# Patient Record
Sex: Male | Born: 1990 | Race: White | Hispanic: No | Marital: Single | Smoking: Former smoker
Health system: Southern US, Community
[De-identification: ages and names within clinical notes are randomized; demographics above are authoritative.]

## PROBLEM LIST (undated history)

## (undated) DIAGNOSIS — Z8042 Family history of malignant neoplasm of prostate: Secondary | ICD-10-CM

## (undated) DIAGNOSIS — Z8051 Family history of malignant neoplasm of kidney: Secondary | ICD-10-CM

## (undated) DIAGNOSIS — Z8481 Family history of carrier of genetic disease: Secondary | ICD-10-CM

## (undated) HISTORY — DX: Family history of malignant neoplasm of prostate: Z80.42

## (undated) HISTORY — DX: Family history of malignant neoplasm of kidney: Z80.51

## (undated) HISTORY — DX: Family history of carrier of genetic disease: Z84.81

## (undated) HISTORY — PX: WISDOM TOOTH EXTRACTION: SHX21

---

## 2018-05-30 ENCOUNTER — Ambulatory Visit
Admission: RE | Admit: 2018-05-30 | Discharge: 2018-05-30 | Disposition: A | Discharge status: Home / Self Care | Payer: No Typology Code available for payment source | Source: Ambulatory Visit | Attending: Nurse Practitioner | Admitting: Nurse Practitioner

## 2018-05-30 ENCOUNTER — Other Ambulatory Visit: Payer: Self-pay | Admitting: Nurse Practitioner

## 2018-05-30 DIAGNOSIS — Z021 Encounter for pre-employment examination: Secondary | ICD-10-CM

## 2019-03-24 ENCOUNTER — Emergency Department (HOSPITAL_COMMUNITY)
Admission: EM | Admit: 2019-03-24 | Discharge: 2019-03-24 | Disposition: A | Discharge status: Home / Self Care | Payer: No Typology Code available for payment source | Attending: Emergency Medicine | Admitting: Emergency Medicine

## 2019-03-24 ENCOUNTER — Emergency Department (HOSPITAL_COMMUNITY): Payer: No Typology Code available for payment source

## 2019-03-24 ENCOUNTER — Encounter (HOSPITAL_COMMUNITY): Payer: Self-pay | Admitting: Emergency Medicine

## 2019-03-24 ENCOUNTER — Other Ambulatory Visit: Payer: Self-pay

## 2019-03-24 DIAGNOSIS — Z23 Encounter for immunization: Secondary | ICD-10-CM | POA: Insufficient documentation

## 2019-03-24 DIAGNOSIS — Z2914 Encounter for prophylactic rabies immune globin: Secondary | ICD-10-CM | POA: Insufficient documentation

## 2019-03-24 DIAGNOSIS — W540XXA Bitten by dog, initial encounter: Secondary | ICD-10-CM | POA: Diagnosis not present

## 2019-03-24 DIAGNOSIS — S81811A Laceration without foreign body, right lower leg, initial encounter: Secondary | ICD-10-CM | POA: Diagnosis present

## 2019-03-24 DIAGNOSIS — Y929 Unspecified place or not applicable: Secondary | ICD-10-CM | POA: Insufficient documentation

## 2019-03-24 DIAGNOSIS — Y9389 Activity, other specified: Secondary | ICD-10-CM | POA: Diagnosis not present

## 2019-03-24 DIAGNOSIS — Y999 Unspecified external cause status: Secondary | ICD-10-CM | POA: Insufficient documentation

## 2019-03-24 MED ORDER — RABIES IMMUNE GLOBULIN 150 UNIT/ML IM INJ
20.0000 [IU]/kg | INJECTION | Freq: Once | INTRAMUSCULAR | Status: AC
  Administered 2019-03-24: 2175 [IU] via INTRAMUSCULAR
  Filled 2019-03-24: qty 14.5

## 2019-03-24 MED ORDER — IBUPROFEN 400 MG PO TABS
600.0000 mg | ORAL_TABLET | Freq: Once | ORAL | Status: AC
  Administered 2019-03-24: 21:00:00 600 mg via ORAL
  Filled 2019-03-24: qty 1

## 2019-03-24 MED ORDER — RABIES VACCINE, PCEC IM SUSR
1.0000 mL | Freq: Once | INTRAMUSCULAR | Status: AC
  Administered 2019-03-24: 1 mL via INTRAMUSCULAR
  Filled 2019-03-24: qty 1

## 2019-03-24 MED ORDER — AMOXICILLIN-POT CLAVULANATE 875-125 MG PO TABS
1.0000 | ORAL_TABLET | Freq: Once | ORAL | Status: AC
  Administered 2019-03-24: 23:00:00 1 via ORAL
  Filled 2019-03-24: qty 1

## 2019-03-24 MED ORDER — AMOXICILLIN-POT CLAVULANATE 875-125 MG PO TABS: 1.0000 | ORAL_TABLET | Freq: Two times a day (BID) | ORAL | 0 refills | Status: DC

## 2019-03-24 NOTE — Discharge Instructions (Addendum)
Please read instructions below.  Keep your wound clean and covered. In 24 hours, you can get your wound wet; gently clean it with soap and water, pat it dry, and reapply a clean bandage. Elevate your leg as much as possible to help with swelling and pain. You can take ibuprofen/advil every 6 hours as needed for pain. Take the antibiotic as prescribed until completely gone. Report to the [facility]gent care for your repeat rabies vaccinations. Follow up with your primary care or urgent care for wound recheck in 3 days.  Return to the ER for fever, pus draining from wound, redness, or new or worsening symptoms.

## 2019-03-24 NOTE — ED Notes (Signed)
Patient transported to X-ray 

## 2019-03-24 NOTE — ED Notes (Signed)
Patient verbalizes understanding of discharge instructions. Opportunity for questioning and answers were provided. Armband removed by staff, pt discharged from ED via wheelchair to home.  

## 2019-03-24 NOTE — ED Provider Notes (Signed)
[facility] EMERGENCY DEPARTMENT Provider Note   CSN: [id number] Arrival date & time: 03/24/19  2017    History   Chief Complaint Chief Complaint  Patient presents with  . Animal Bite    HPI Carlos Rose is a 28 y.o. male without significant past medical history, presenting to the emergency department with a dog bite to his right calf that occurred prior to arrival.  Patient is [employer] and was in pursuit of a suspect when he encountered a dog.  He states the dog bit him through his pants.  He has associated wounds and pain.  Animal control does have the dog in custody and is quarantining, as rabies vaccination status is currently unknown.  Patient states his tetanus vaccine was updated less than 5 years ago.  He has no history of immunocompromise.  He has no other injuries to report.     The history is provided by the patient.    History reviewed. No pertinent past medical history.  There are no active problems to display for this patient.   History reviewed. No pertinent surgical history.      Home Medications    Prior to Admission medications   Medication Sig Start Date End Date Taking? Authorizing Provider  amoxicillin-clavulanate (AUGMENTIN) 875-125 MG tablet Take 1 tablet by mouth every 12 (twelve) hours. 03/24/19   Jezebelle Ledwell, [place] N, PA-C    Family History No family history on file.  Social History Social History   Tobacco Use  . Smoking status: Not on file  Substance Use Topics  . Alcohol use: Not on file  . Drug use: Not on file     Allergies   Patient has no allergy information on record.   Review of Systems Review of Systems  Musculoskeletal: Positive for myalgias.  Skin: Positive for wound.  Allergic/Immunologic: Negative for immunocompromised state.  Neurological: Negative for numbness.     Physical Exam Updated Vital Signs BP (!) 134/94 (BP Location: Right Arm)   Pulse 70   Temp 98.3 F (36.8 C) (Oral)   Resp 16   Ht  6' (1.829 m)   Wt 108 kg   SpO2 97%   BMI 32.28 kg/m   Physical Exam Vitals signs and nursing note reviewed.  Constitutional:      General: He is not in acute distress.    Appearance: He is well-developed.  HENT:     Head: Normocephalic and atraumatic.  Eyes:     Conjunctiva/sclera: Conjunctivae normal.  Cardiovascular:     Rate and Rhythm: Normal rate.     Comments: Intact PT pulses Pulmonary:     Effort: Pulmonary effort is normal.  Musculoskeletal:     Comments: Pt with 3 linear lacerations to the posteromedial right calf. Wounds are about 2-2.5cm in length and gaping. No muscles visualized at the base of the wound. No grossly contaminated. Mild assoc tenderness.  Neurological:     Mental Status: He is alert.  Psychiatric:        Mood and Affect: Mood normal.        Behavior: Behavior normal.      ED Treatments / Results  Labs (all labs ordered are listed, but only abnormal results are displayed) Labs Reviewed - No data to display  EKG None  Radiology Dg Tibia/fibula Right  Result Date: 03/24/2019 CLINICAL DATA:  Dog bite to right calf. Rule out foreign body. EXAM: RIGHT TIBIA AND FIBULA - 2 VIEW COMPARISON:  None. FINDINGS: Soft tissue irregularity  and lucency involving the posteromedial calf is consistent with the provided history. No radiopaque foreign body or fracture is identified. The knee and ankle are located. IMPRESSION: Soft tissue injury without evidence of acute osseous abnormality or radiopaque foreign body. Electronically Signed   By: Sebastian AcheAllen  Grady M.D.   On: 03/24/2019 21:16    Procedures .Marland Kitchen.Laceration Repair Date/Time: 03/24/2019 10:34 PM Performed by: Macklen Wilhoite, [place] N, PA-C Authorized by: Srihith Aquilino, [place] N, PA-C   Consent:    Consent obtained:  Verbal   Consent given by:  Patient   Risks discussed:  Infection, pain and poor cosmetic result   Alternatives discussed:  No treatment Anesthesia (see MAR for exact dosages):    Anesthesia method:   Local infiltration   Local anesthetic:  Lidocaine 2% WITH epi Laceration details:    Location:  Leg   Leg location:  R lower leg   Length (cm):  2 (3 lacerations about 2-2.5cm in length) Repair type:    Repair type:  Simple Pre-procedure details:    Preparation:  Patient was prepped and draped in usual sterile fashion and imaging obtained to evaluate for foreign bodies Exploration:    Hemostasis achieved with:  Direct pressure   Wound exploration: wound explored through full range of motion and entire depth of wound probed and visualized     Wound extent: no foreign bodies/material noted and no muscle damage noted   Treatment:    Area cleansed with:  Saline   Amount of cleaning:  Extensive   Irrigation solution:  Sterile saline   Irrigation method:  Pressure wash   Visualized foreign bodies/material removed: no   Skin repair:    Repair method:  Sutures   Suture size:  4-0   Suture material:  Nylon   Suture technique:  Simple interrupted   Number of sutures:  6 (2 sutures placed in each wound, totalling 6) Approximation:    Approximation:  Loose Post-procedure details:    Dressing:  Non-adherent dressing   Patient tolerance of procedure:  Tolerated well, no immediate complications   (including critical care time)  Medications Ordered in ED Medications  ibuprofen (ADVIL) tablet 600 mg (600 mg Oral Given 03/24/19 2041)  rabies immune globulin (HYPERAB/KEDRAB) injection 2,175 Units (2,175 Units Intramuscular Given 03/24/19 2205)  rabies vaccine (RABAVERT) injection 1 mL (1 mL Intramuscular Given 03/24/19 2202)     Initial Impression / Assessment and Plan / ED Course  I have reviewed the triage vital signs and the nursing notes.  Pertinent labs & imaging results that were available during my care of the patient were reviewed by me and considered in my medical decision making (see chart for details).        Patient presents with lacerations to right calf from a dog bite while  on duty with [employer].  Pt wounds irrigated copiously with sterile saline.  Wounds examined with visualization of the base and no foreign bodies seen.  Xray neg for retained FOB.  Capillary refill intact and pt without neurologic deficit.  Patient tetanus up to date. Patient rabies vaccine and immunoglobulin risk and benefit discussed.  Pt consents. Pain treated in the emergency department. Wounds closed loosely as they were gaping. Will discharge home with pain medication, Augmentin and requests for close follow-up with urgent care for recheck in 3 days. Discussed reasons to return sooner.  Discussed results, findings, treatment and follow up. Patient advised of return precautions. Patient verbalized understanding and agreed with plan.  Final Clinical Impressions(s) / ED  Diagnoses   Final diagnoses:  Dog bite, initial encounter    ED Discharge Orders         Ordered    amoxicillin-clavulanate (AUGMENTIN) 875-125 MG tablet  Every 12 hours     03/24/19 2242           Rachit Grim, [place] N, PA-C 03/24/19 2243    Gerhard Munch, MD 03/25/19 0003

## 2019-03-24 NOTE — ED Triage Notes (Signed)
[facility]- pt here due to getting bit by a diog during suspect chase. 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 EMS reports 3 lacs to his right knee.    126/80 82 HR 97% RA 16 RR

## 2019-03-25 MED FILL — AMOX-CLAV 875-125 MG TABLET: 875-125 | 7 days supply | Qty: 14 | Fill #0

## 2019-03-27 ENCOUNTER — Other Ambulatory Visit: Payer: Self-pay

## 2019-03-27 ENCOUNTER — Ambulatory Visit (HOSPITAL_COMMUNITY)
Admission: EM | Admit: 2019-03-27 | Discharge: 2019-03-27 | Disposition: A | Discharge status: Home / Self Care | Payer: 59 | Attending: Family Medicine | Admitting: Family Medicine

## 2019-03-27 DIAGNOSIS — W540XXD Bitten by dog, subsequent encounter: Secondary | ICD-10-CM

## 2019-03-27 DIAGNOSIS — W540XXA Bitten by dog, initial encounter: Secondary | ICD-10-CM | POA: Diagnosis not present

## 2019-03-27 DIAGNOSIS — S81851A Open bite, right lower leg, initial encounter: Secondary | ICD-10-CM

## 2019-03-27 DIAGNOSIS — Z203 Contact with and (suspected) exposure to rabies: Secondary | ICD-10-CM

## 2019-03-27 DIAGNOSIS — Z23 Encounter for immunization: Secondary | ICD-10-CM

## 2019-03-27 MED ORDER — RABIES VACCINE, PCEC IM SUSR
INTRAMUSCULAR | Status: AC
  Filled 2019-03-27: qty 1

## 2019-03-27 MED ORDER — RABIES VACCINE, PCEC IM SUSR
1.0000 mL | Freq: Once | INTRAMUSCULAR | Status: AC
  Administered 2019-03-27: 09:00:00 1 mL via INTRAMUSCULAR

## 2019-03-27 NOTE — Discharge Instructions (Addendum)
Keep clean and dry Watch for infection Call for follow up visits

## 2019-03-27 NOTE — ED Triage Notes (Signed)
Pt arrived for day 3 rabies and wound recheck

## 2019-03-27 NOTE — ED Provider Notes (Signed)
[facility]    CSN: [id number] Arrival date & time: 03/27/19  0847     History   Chief Complaint No chief complaint on file.   HPI Carlos Rose is a 28 y.o. male.   H[place] [employer] [place] on [place].  He was attacked from behind by pit bull which latched onto his right calf.  He was seen in the emergency department and sutures were placed in 3-4 deep wounds.  His calf is tender.  He has a lot of bruising.  He is taking care of the stitches.  No redness, swelling, discharge. He is here for follow-up rabies series.  The dog is being restrained and tested, however, was behind on its vaccinations.  No past medical history on file.  There are no active problems to display for this patient.   No past surgical history on file.     Home Medications    Prior to Admission medications   Medication Sig Start Date End Date Taking? Authorizing Provider  amoxicillin-clavulanate (AUGMENTIN) 875-125 MG tablet Take 1 tablet by mouth every 12 (twelve) hours. 03/24/19   Robinson, [place] N, PA-C    Family History No family history on file.  Social History Social History   Tobacco Use  . Smoking status: Not on file  Substance Use Topics  . Alcohol use: Not on file  . Drug use: Not on file     Allergies   Patient has no allergy information on record.   Review of Systems Review of Systems  Constitutional: Negative for chills and fever.  HENT: Negative for ear pain and sore throat.   Eyes: Negative for pain and visual disturbance.  Respiratory: Negative for cough and shortness of breath.   Cardiovascular: Negative for chest pain and palpitations.  Gastrointestinal: Negative for abdominal pain and vomiting.  Genitourinary: Negative for dysuria and hematuria.  Musculoskeletal: Negative for arthralgias and back pain.  Skin: Positive for wound. Negative for color change and rash.  Neurological: Negative for seizures and syncope.  All other systems  reviewed and are negative.    Physical Exam Triage Vital Signs ED Triage Vitals  Enc Vitals Group     BP 03/27/19 0915 135/80     Pulse Rate 03/27/19 0915 (!) 53     Resp 03/27/19 0915 20     Temp 03/27/19 0915 97.6 F (36.4 C)     Temp Source 03/27/19 0915 Oral     SpO2 03/27/19 0915 100 %   No data found.  Updated Vital Signs BP 135/80   Pulse (!) 53   Temp 97.6 F (36.4 C) (Oral)   Resp 20   SpO2 100%     Bilateral Near:     Physical Exam Constitutional:      General: He is not in acute distress.    Appearance: He is well-developed. He is not ill-appearing.  HENT:     Head: Normocephalic and atraumatic.  Eyes:     Conjunctiva/sclera: Conjunctivae normal.     Pupils: Pupils are equal, round, and reactive to light.  Neck:     Musculoskeletal: Normal range of motion.  Cardiovascular:     Rate and Rhythm: Normal rate.  Pulmonary:     Effort: Pulmonary effort is normal. No respiratory distress.  Abdominal:     General: There is no distension.     Palpations: Abdomen is soft.  Musculoskeletal: Normal range of motion.  Skin:    General: Skin is warm and dry.  Comments: Right posterior calf has mild soft tissue swelling generally.  There is ecchymosis that is 12 x 14 cm.  In the center of this there for deep linear wounds that have 2 loose sutures each.  No erythema or tenderness.  Minimal induration  Neurological:     General: No focal deficit present.     Mental Status: He is alert.  Psychiatric:        Mood and Affect: Mood normal.        Behavior: Behavior normal.      UC Treatments / Results  Labs (all labs ordered are listed, but only abnormal results are displayed) Labs Reviewed - No data to display  EKG None  Radiology No results found.  Procedures Procedures (including critical care time)  Medications Ordered in UC Medications  rabies vaccine (RABAVERT) injection 1 mL (1 mL Intramuscular Given 03/27/19 0918)    Initial Impression /  Assessment and Plan / UC Course  I have reviewed the triage vital signs and the nursing notes.  Pertinent labs & imaging results that were available during my care of the patient were reviewed by me and considered in my medical decision making (see chart for details).     Good wound care.  No evidence of infection.  Discussed expectations.  Rabies vaccinations.  Wound care.  Return visits. Final Clinical Impressions(s) / UC Diagnoses   Final diagnoses:  Dog bite, subsequent encounter  Need for prophylactic vaccination against rabies     Discharge Instructions     Keep clean and dry Watch for infection Call for follow up visits   ED Prescriptions    None     Controlled Substance Prescriptions [place] Controlled Substance Registry consulted? Not Applicable   Eustace Moore, MD 03/27/19 1110

## 2019-03-31 ENCOUNTER — Ambulatory Visit (HOSPITAL_COMMUNITY)
Admission: EM | Admit: 2019-03-31 | Discharge: 2019-03-31 | Disposition: A | Discharge status: Home / Self Care | Payer: 59 | Attending: Family Medicine | Admitting: Family Medicine

## 2019-03-31 DIAGNOSIS — Z203 Contact with and (suspected) exposure to rabies: Secondary | ICD-10-CM | POA: Diagnosis not present

## 2019-03-31 DIAGNOSIS — Z23 Encounter for immunization: Secondary | ICD-10-CM | POA: Diagnosis not present

## 2019-03-31 MED ORDER — RABIES VACCINE, PCEC IM SUSR
1.0000 mL | Freq: Once | INTRAMUSCULAR | Status: AC
  Administered 2019-03-31: 1 mL via INTRAMUSCULAR

## 2019-03-31 MED ORDER — RABIES VACCINE, PCEC IM SUSR
INTRAMUSCULAR | Status: AC
  Filled 2019-03-31: qty 1

## 2019-03-31 NOTE — ED Triage Notes (Signed)
Pt presents to UC for day 7 of rabies vaccine.

## 2019-04-07 ENCOUNTER — Ambulatory Visit (HOSPITAL_COMMUNITY)
Admission: EM | Admit: 2019-04-07 | Discharge: 2019-04-07 | Disposition: A | Discharge status: Home / Self Care | Payer: 59 | Attending: Family Medicine | Admitting: Family Medicine

## 2019-04-07 ENCOUNTER — Other Ambulatory Visit: Payer: Self-pay

## 2019-04-07 DIAGNOSIS — Z203 Contact with and (suspected) exposure to rabies: Secondary | ICD-10-CM

## 2019-04-07 DIAGNOSIS — Z23 Encounter for immunization: Secondary | ICD-10-CM

## 2019-04-07 MED ORDER — RABIES VACCINE, PCEC IM SUSR
INTRAMUSCULAR | Status: AC
  Filled 2019-04-07: qty 1

## 2019-04-07 MED ORDER — RABIES VACCINE, PCEC IM SUSR
1.0000 mL | Freq: Once | INTRAMUSCULAR | Status: AC
  Administered 2019-04-07: 1 mL via INTRAMUSCULAR

## 2019-04-07 NOTE — ED Triage Notes (Signed)
Pt presents today for rabies vaccine.

## 2019-12-25 ENCOUNTER — Ambulatory Visit: Payer: 59 | Admitting: Family Medicine

## 2020-01-09 ENCOUNTER — Ambulatory Visit: Payer: 59 | Attending: Internal Medicine

## 2020-01-09 DIAGNOSIS — Z23 Encounter for immunization: Secondary | ICD-10-CM | POA: Insufficient documentation

## 2020-01-09 NOTE — Progress Notes (Signed)
   Covid-19 Vaccination Clinic  Name:  Freddrick Gladson    MRN: [id number] DOB: 01-03-1991  01/09/2020  Mr. Lorson was observed post Covid-19 immunization for 15 minutes without incidence. He was provided with Vaccine Information Sheet and instruction to access the V-Safe system.   Mr. Spinney was instructed to call 911 with any severe reactions post vaccine: Marland Kitchen Difficulty breathing  . Swelling of your face and throat  . A fast heartbeat  . A bad rash all over your body  . Dizziness and weakness    Immunizations Administered    Name Date Dose VIS Date Route   Pfizer COVID-19 Vaccine 01/09/2020 11:48 AM 0.3 mL 10/31/2019 Intramuscular   Manufacturer: [employer], [employer]   Lot: [id number]   NDC: [address]-1

## 2020-02-03 ENCOUNTER — Ambulatory Visit: Payer: 59 | Attending: Internal Medicine

## 2020-02-03 DIAGNOSIS — Z23 Encounter for immunization: Secondary | ICD-10-CM

## 2020-02-03 NOTE — Progress Notes (Signed)
   Covid-19 Vaccination Clinic  Name:  Carlos Rose    MRN: [id number] DOB: [birth year]  02/03/2020  [name] was observed post Covid-19 immunization for 15 minutes without incident. He was provided with Vaccine Information Sheet and instruction to access the V-Safe system.   [name] was instructed to call 911 with any severe reactions post vaccine: Marland Kitchen Difficulty breathing  . Swelling of face and throat  . A fast heartbeat  . A bad rash all over body  . Dizziness and weakness   Immunizations Administered    Name Date Dose VIS Date Route   Pfizer COVID-19 Vaccine 02/03/2020 11:36 AM 0.3 mL 10/31/2019 Intramuscular   Manufacturer: [employer], [employer]   Lot: [id number]   NDC: [address]-0

## 2020-05-10 ENCOUNTER — Encounter: Payer: Self-pay | Admitting: Family Medicine

## 2020-05-10 ENCOUNTER — Ambulatory Visit: Payer: 59 | Admitting: Family Medicine

## 2020-05-10 ENCOUNTER — Other Ambulatory Visit: Payer: Self-pay

## 2020-05-10 VITALS — BP 124/86 | HR 71 | Temp 98.1°F | Ht 72.0 in | Wt 253.8 lb

## 2020-05-10 DIAGNOSIS — Z87820 Personal history of traumatic brain injury: Secondary | ICD-10-CM

## 2020-05-10 DIAGNOSIS — E6609 Other obesity due to excess calories: Secondary | ICD-10-CM | POA: Diagnosis not present

## 2020-05-10 DIAGNOSIS — M25572 Pain in left ankle and joints of left foot: Secondary | ICD-10-CM

## 2020-05-10 DIAGNOSIS — G44229 Chronic tension-type headache, not intractable: Secondary | ICD-10-CM | POA: Insufficient documentation

## 2020-05-10 DIAGNOSIS — Z6834 Body mass index (BMI) 34.0-34.9, adult: Secondary | ICD-10-CM

## 2020-05-10 DIAGNOSIS — M25561 Pain in right knee: Secondary | ICD-10-CM

## 2020-05-10 DIAGNOSIS — S069X9A Unspecified intracranial injury with loss of consciousness of unspecified duration, initial encounter: Secondary | ICD-10-CM | POA: Insufficient documentation

## 2020-05-10 DIAGNOSIS — G8929 Other chronic pain: Secondary | ICD-10-CM

## 2020-05-10 DIAGNOSIS — M25562 Pain in left knee: Secondary | ICD-10-CM | POA: Insufficient documentation

## 2020-05-10 DIAGNOSIS — M25571 Pain in right ankle and joints of right foot: Secondary | ICD-10-CM

## 2020-05-10 DIAGNOSIS — M5442 Lumbago with sciatica, left side: Secondary | ICD-10-CM

## 2020-05-10 DIAGNOSIS — Z7689 Persons encountering health services in other specified circumstances: Secondary | ICD-10-CM

## 2020-05-10 LAB — LIPID PANEL
Cholesterol: 167 mg/dL (ref 0–200)
HDL: 45.2 mg/dL (ref >39.00)
LDL Cholesterol: 102 mg/dL — ABNORMAL HIGH (ref 0–99)
NonHDL: 121.36
Total CHOL/HDL Ratio: 4
Triglycerides: 98 mg/dL (ref 0.0–149.0)
VLDL: 19.6 mg/dL (ref 0.0–40.0)

## 2020-05-10 LAB — CBC WITH DIFFERENTIAL/PLATELET
Basophils Absolute: 0 10*3/uL (ref 0.0–0.1)
Basophils Relative: 0.3 % (ref 0.0–3.0)
Eosinophils Absolute: 0.3 10*3/uL (ref 0.0–0.7)
Eosinophils Relative: 5.1 % — ABNORMAL HIGH (ref 0.0–5.0)
HCT: 38.3 % — ABNORMAL LOW (ref 39.0–52.0)
Hemoglobin: 12.8 g/dL — ABNORMAL LOW (ref 13.0–17.0)
Lymphocytes Relative: 34.3 % (ref 12.0–46.0)
Lymphs Abs: 2.2 10*3/uL (ref 0.7–4.0)
MCHC: 33.6 g/dL (ref 30.0–36.0)
MCV: 82.1 fl (ref 78.0–100.0)
Monocytes Absolute: 0.4 10*3/uL (ref 0.1–1.0)
Monocytes Relative: 5.7 % (ref 3.0–12.0)
Neutro Abs: 3.6 10*3/uL (ref 1.4–7.7)
Neutrophils Relative %: 54.6 % (ref 43.0–77.0)
Platelets: 266 10*3/uL (ref 150.0–400.0)
RBC: 4.66 Mil/uL (ref 4.22–5.81)
RDW: 13.7 % (ref 11.5–15.5)
WBC: 6.5 10*3/uL (ref 4.0–10.5)

## 2020-05-10 LAB — COMPREHENSIVE METABOLIC PANEL
ALT: 28 U/L (ref 0–53)
AST: 24 U/L (ref 0–37)
Albumin: 4.4 g/dL (ref 3.5–5.2)
Alkaline Phosphatase: 51 U/L (ref 39–117)
BUN: 19 mg/dL (ref 6–23)
CO2: 27 mEq/L (ref 19–32)
Calcium: 9.2 mg/dL (ref 8.4–10.5)
Chloride: 104 mEq/L (ref 96–112)
Creatinine, Ser: 1.02 mg/dL (ref 0.40–1.50)
GFR: 86.04 mL/min (ref >60.00)
Glucose, Bld: 91 mg/dL (ref 70–99)
Potassium: 4.2 mEq/L (ref 3.5–5.1)
Sodium: 136 mEq/L (ref 135–145)
Total Bilirubin: 0.4 mg/dL (ref 0.2–1.2)
Total Protein: 7 g/dL (ref 6.0–8.3)

## 2020-05-10 NOTE — Patient Instructions (Addendum)
Good to see you today  Find out if they will accept a letter from Nurse Practitioner and let me know  Look for the date of your last tetanus vaccine

## 2020-05-10 NOTE — Progress Notes (Signed)
Subjective:    Patient ID: Carlos Rose, male    DOB: Aug 12, 1991, 28 y.o.   MRN: [id number]  HPI Chief Complaint  Patient presents with  . establishing care  This is a 29 yo male who presents today to establish care. 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 Has been married to wife Carlos Rose for 1 year. Enjoys hanging out, exercise, video games.   Last CPE- 2019- pre employement Tdap- ? 2018 Covid- has completed series Dental- going today Eye- within last year Exercise- regular, heavy weights, running, stretching Sleep- 6-7 hours a night, feels rested Diet- usually brings his lunch, high protein, lower carb most of the time.   [employer] claim- [employer] [phone]- 2019. Air[employer]. History of TBI, sciatic nerve impingement, chronic back injury. Requesting [employer] letter. Unsure if this can be written by NP- he will find out.   Headaches- occasional, light sensitive, temple area, 2-3x/ week, 2-8 hours, hydrate, hot showers, naproxen. No visual changes.   Mild TBI- some problems with short term memory, has to write everything down, difficulty remembering names, face. No irritability.   Right shoulder- old baseball injury, little problems with this  Back pain- mid to lower, spasms, 3-4 times a week, increased frequency, has to lie down on hard surface, relief in 15-20 minutes. Sitting for long periods of time, jerking, twisting.   Knees- wear and tear, seem to be getting better, some left giving out in past, now better. Occasionally feels pain with going upstairs. Occasional swelling.   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 Daily stretching helps. Pain intolerable 2-3 times a week. Occasional naproxen/ ibuprofen. PT summer 2018.     Review of Systems Per above and denies chest pain, shortness of breath, abdominal pain, diarrhea, constipation, dysuria, urinary  frequency, hematuria.    Objective:   Physical Exam  Physical Exam  Constitutional: Oriented to person, place, and time. He appears well-developed and well-nourished.  HENT:  Head: Normocephalic and atraumatic.  Eyes: Conjunctivae are normal.  Neck: Normal range of motion. Neck supple.  Cardiovascular: Normal rate, regular rhythm and normal heart sounds.   Pulmonary/Chest: Effort normal and breath sounds normal.  Musculoskeletal: No LE edema. Normal gait.  Neurological: Alert and oriented to person, place, and time.  Skin: Skin is warm and dry.  Psychiatric: Normal mood and affect. Behavior is normal. Judgment and thought content normal.  Vitals reviewed.    BP 124/86   Pulse 71   Temp 98.1 F (36.7 C) (Temporal)   Ht 6' (1.829 m)   Wt 253 lb 12 oz (115.1 kg)   SpO2 98%   BMI 34.41 kg/m  Wt Readings from Last 3 Encounters:  05/10/20 253 lb 12 oz (115.1 kg)  03/24/19 238 lb (108 kg)        Assessment & Plan:  1. Encounter to establish care - reviewed available records in EMR as well as records that patient is with him today -He has requested some paperwork be completed and further information is required to complete, he will get back in touch with me regarding this  2. Class 1 obesity due to excess calories without serious comorbidity with body mass index (BMI) of 34.0 to 34.9 in adult -Encouraged healthy food choices, adequate hydration, continue regular exercise - CBC with Differential - Comprehensive metabolic panel - Lipid Panel  3. History of traumatic brain injury -Continued symptoms per above  4. Chronic tension-type headache, not intractable -Occurring several times a week, resolved with over-the-counter medication and good hydration.  Discussed avoiding triggers.  5. Chronic pain of both ankles -He is able to do most activities without impairment.  6. Chronic bilateral low back pain with left-sided sciatica -Encouraged him to continue regular  stretching and exercise  7. Chronic pain of both knees -Mostly triggered with running up stairs, seems to do okay with other moderate to high intensity exercise and daily activity  Over 45 minutes were spent face-to-face with the patient during this encounter and >50% of that time was spent on counseling and coordination of care  This visit occurred during the SARS-CoV-2 public health emergency.  Safety protocols were in place, including screening questions prior to the visit, additional usage of staff PPE, and extensive cleaning of exam room while observing appropriate contact time as indicated for disinfecting solutions.    Olean Ree, FNP-BC  [facility], [facility]  05/10/2020 2:57 PM

## 2020-05-17 ENCOUNTER — Encounter: Payer: Self-pay | Admitting: *Deleted

## 2020-06-07 ENCOUNTER — Encounter: Payer: Self-pay | Admitting: Family Medicine

## 2020-06-11 ENCOUNTER — Encounter: Payer: Self-pay | Admitting: Family Medicine

## 2020-06-21 ENCOUNTER — Encounter: Payer: Self-pay | Admitting: Family Medicine

## 2020-06-23 ENCOUNTER — Other Ambulatory Visit: Payer: Self-pay | Admitting: Family Medicine

## 2020-06-23 DIAGNOSIS — Z809 Family history of malignant neoplasm, unspecified: Secondary | ICD-10-CM

## 2020-06-23 DIAGNOSIS — Z8481 Family history of carrier of genetic disease: Secondary | ICD-10-CM

## 2020-06-28 ENCOUNTER — Inpatient Hospital Stay: Payer: 59 | Attending: Oncology | Admitting: Licensed Clinical Social Worker

## 2020-06-28 ENCOUNTER — Encounter: Payer: Self-pay | Admitting: Licensed Clinical Social Worker

## 2020-06-28 ENCOUNTER — Inpatient Hospital Stay: Payer: 59

## 2020-06-28 ENCOUNTER — Other Ambulatory Visit: Payer: Self-pay

## 2020-06-28 DIAGNOSIS — Z8042 Family history of malignant neoplasm of prostate: Secondary | ICD-10-CM

## 2020-06-28 DIAGNOSIS — Z8051 Family history of malignant neoplasm of kidney: Secondary | ICD-10-CM | POA: Insufficient documentation

## 2020-06-28 DIAGNOSIS — Z8481 Family history of carrier of genetic disease: Secondary | ICD-10-CM
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
## 2020-08-02 ENCOUNTER — Telehealth: Payer: Self-pay | Admitting: Licensed Clinical Social Worker

## 2020-08-02 ENCOUNTER — Encounter: Payer: Self-pay | Admitting: Licensed Clinical Social Worker

## 2020-08-02 ENCOUNTER — Ambulatory Visit: Payer: Self-pay | Admitting: Licensed Clinical Social Worker

## 2020-08-02 DIAGNOSIS — Z1379 Encounter for other screening for genetic and chromosomal anomalies: Secondary | ICD-10-CM | POA: Insufficient documentation

## 2020-08-02 DIAGNOSIS — Z8042 Family history of malignant neoplasm of prostate: Secondary | ICD-10-CM

## 2020-08-02 DIAGNOSIS — Z8051 Family history of malignant neoplasm of kidney: Secondary | ICD-10-CM

## 2020-08-02 DIAGNOSIS — Z8481 Family history of carrier of genetic disease: Secondary | ICD-10-CM

## 2020-08-02 NOTE — Telephone Encounter (Signed)
Revealed negative genetic testing.  Revealed that a VUS in PMS2 was identified. This normal result is reassuring.  It is unlikely that there is an increased risk of cancer due to a mutation in one of these genes.  However, genetic testing is not perfect, and cannot definitively rule out a hereditary cause.  It will be important for him to keep in contact with genetics to learn if any additional testing may be needed in the future.

## 2020-08-02 NOTE — Progress Notes (Addendum)
HPI:  [name] was previously seen in the [facility] clinic due to a family history of cancer, family history of BRCA2 mutation and concerns regarding a hereditary predisposition to cancer. Please refer to our prior cancer genetics clinic note for more information regarding our discussion, assessment and recommendations, at the time. [name] recent genetic test results were disclosed to him, as were recommendations warranted by these results. These results and recommendations are discussed in more detail below.  CANCER HISTORY:  Oncology History   No history exists.    FAMILY HISTORY:  We obtained a detailed, 4-generation family history.  Significant diagnoses are listed below: Family History  Problem Relation Age of Onset  . Diabetes Maternal Grandmother   . Kidney cancer Maternal Grandmother   . Hearing loss Maternal Grandfather   . Hyperlipidemia Maternal Grandfather   . Hypertension Maternal Grandfather   . Prostate cancer Paternal Grandfather    [name] does not have children yet. He has 2 younger brothers who have not had genetic testing yet but have appointments to in [place].   [name] father is living at 19 and does not want to have genetic testing. Patient has 4 paternal aunts and 1 paternal uncle. One aunt is his father's twin, and she has not had testing. Two other aunts are also twins and one has reportedly tested positive for BRCA2. His uncle also reportedly tested positive, as well as a cousin. Paternal grandmother passed in her 28s. Paternal grandfather had prostate cancer that was metastatic in his 61s.   [name] mother is living at 80, no cancer history. Patient has 1 maternal aunt, 1 maternal uncle, no cancers. No known cancers in maternal cousins. Maternal grandfather is living in his 73s. Grandmother had kidney cancer and is living in her 65s.   [name] is aware of previous family history of genetic testing for hereditary cancer risks.  Patient's maternal ancestors are of English/Scottish/Irish descent, and paternal ancestors are of Middle Eastern/Mediterranean/European descent. There is no reported Ashkenazi Jewish ancestry. There is no known consanguinity.     GENETIC TEST RESULTS: Genetic testing reported out on 07/29/2020 through the [facility] Common Hereditary Cancers Panel +Renal  cancer panel found no pathogenic mutations.   The Common Hereditary Cancers Panel+ Renal Cancer Panel  offered by [facility] includes sequencing and/or deletion duplication testing of the following 79 genes:APC*, ATM*, AXIN2, BAP1, BARD1, BMPR1A, BRCA1, BRCA2, BRIP1, CDC73, CDH1, CDK4, CDKN1C, CDKN2A (p14ARF), CDKN2A (p16INK4a), CHEK2, CTNNA1, DICER1*, DIS3L2, EPCAM*, FH*, FLCN, GPC3*, GREM1*, HOXB13, KIT, MEN1*, MET*, MLH1*, MSH2*, MSH3*, MSH6*, MUTYH, NBN, NF1*, NTHL1, PALB2, PDGFRA, PMS2*, POLD1*, POLE, PTEN*, RAD50, RAD51C, RAD51D, REST, RNF43, SDHA*, SDHB, SDHC*, SDHD, SMAD4, SMARCA4, SMARCB1, STK11, TP53, TSC1*, TSC2, VHL, WT1.   The test report has been scanned into EPIC and is located under the Molecular Pathology section of the Results Review tab.  A portion of the result report is included below for reference.     We discussed with [name] that because current genetic testing is not perfect, it is possible there may be a gene mutation in one of these genes that current testing cannot detect, but that chance is small.  We also discussed, that there could be another gene that has not yet been discovered, or that we have not yet tested, that is responsible for the cancer diagnoses in the family. It is also possible there is a hereditary cause for the cancer in the family that [name]  and therefore was not identified in his testing.  Therefore, it is important to remain in touch with cancer genetics in the future so that we can continue to offer [name] the most up to date genetic testing.   Genetic testing did identify a variant  of uncertain significance (VUS) in the PMS2 gene called c.1501G>A.  At this time, it is unknown if this variant is associated with increased cancer risk or if this is a normal finding, but most variants such as this get reclassified to being inconsequential. It should not be used to make medical management decisions. With time, we suspect the lab will determine the significance of this variant, if any. If we do learn more about it, we will try to contact [name] to discuss it further. However, it is important to stay in touch with [place] periodically and keep the address and phone number up to date.   ADDITIONAL GENETIC TESTING: We discussed with [name] that his genetic testing was fairly extensive.  If there are genes identified to increase cancer risk that can be analyzed in the future, we would be happy to discuss and coordinate this testing at that time.    CANCER SCREENING RECOMMENDATIONS: [name] test result is considered negative (normal).  This means that we have not identified a hereditary cause for his family history of cancer at this time, and we also did not identify a BRCA2 mutation in him which we know is on the paternal side of the family.   While reassuring, this does not definitively rule out a hereditary predisposition to cancer. It is still possible that there could be genetic mutations that are undetectable by current technology. There could be genetic mutations in genes that have not been tested or identified to increase cancer risk.  Therefore, it is recommended he continue to follow the cancer management and screening guidelines provided by his primary healthcare provider.   An individual's cancer risk and medical management are not determined by genetic test results alone. Overall cancer risk assessment incorporates additional factors, including personal medical history, family history, and any available genetic information that may result in a personalized plan for cancer  prevention and surveillance.  RECOMMENDATIONS FOR FAMILY MEMBERS:  Relatives in this family might be at some increased risk of developing cancer, over the general population risk, simply due to the family history of cancer.  We recommended male relatives in this family have a yearly mammogram beginning at age 92, or 24 years younger than the earliest onset of cancer, an annual clinical breast exam, and perform monthly breast self-exams. Male relatives in this family should also have a gynecological exam as recommended by their primary provider.  All family members should be referred for colonoscopy starting at age 13.   Based on [name] family history, we recommended paternal relatives (including brothers) have genetic counseling and testing. [name] will let us know if we can be of any assistance in coordinating genetic counseling and/or testing for these family members.  FOLLOW-UP: Lastly, we discussed with [name] that cancer genetics is a rapidly advancing field and it is possible that new genetic tests will be appropriate for him and/or his family members in the future. We encouraged him to remain in contact with cancer genetics on an annual basis so we can update his personal and family histories and let him know of advances in cancer genetics that may benefit this family.   Our contact number was provided. [name] questions  were answered to his satisfaction, and he knows he is welcome to call us at anytime with additional questions or concerns.   Faith Rogue, MS, [facility] Genetic Counselor [email or web address] Phone: [phone]

## 2020-11-07 ENCOUNTER — Emergency Department (HOSPITAL_COMMUNITY)
Admission: EM | Admit: 2020-11-07 | Discharge: 2020-11-07 | Disposition: A | Discharge status: Home / Self Care | Payer: No Typology Code available for payment source | Attending: Emergency Medicine | Admitting: Emergency Medicine

## 2020-11-07 ENCOUNTER — Emergency Department (HOSPITAL_COMMUNITY): Payer: No Typology Code available for payment source

## 2020-11-07 ENCOUNTER — Other Ambulatory Visit: Payer: Self-pay

## 2020-11-07 ENCOUNTER — Encounter (HOSPITAL_COMMUNITY): Payer: Self-pay | Admitting: Emergency Medicine

## 2020-11-07 DIAGNOSIS — S60419A Abrasion of unspecified finger, initial encounter: Secondary | ICD-10-CM | POA: Diagnosis not present

## 2020-11-07 DIAGNOSIS — S8002XA Contusion of left knee, initial encounter: Secondary | ICD-10-CM | POA: Diagnosis not present

## 2020-11-07 DIAGNOSIS — S6992XA Unspecified injury of left wrist, hand and finger(s), initial encounter: Secondary | ICD-10-CM | POA: Diagnosis present

## 2020-11-07 DIAGNOSIS — S60049A Contusion of unspecified ring finger without damage to nail, initial encounter: Secondary | ICD-10-CM | POA: Insufficient documentation

## 2020-11-07 DIAGNOSIS — Z87891 Personal history of nicotine dependence: Secondary | ICD-10-CM | POA: Diagnosis not present

## 2020-11-07 DIAGNOSIS — S6000XA Contusion of unspecified finger without damage to nail, initial encounter: Secondary | ICD-10-CM

## 2020-11-07 DIAGNOSIS — Y92410 Unspecified street and highway as the place of occurrence of the external cause: Secondary | ICD-10-CM | POA: Insufficient documentation
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
## 2020-11-07 NOTE — Discharge Instructions (Addendum)
Return if any problems. Ice to area of swelling.  See the Orthopaedist for recheck this week if pain persist

## 2020-12-08 ENCOUNTER — Other Ambulatory Visit: Payer: Self-pay | Admitting: Family Medicine

## 2021-03-28 ENCOUNTER — Encounter: Payer: 59 | Admitting: Family Medicine

## 2021-04-26 ENCOUNTER — Encounter: Payer: 59 | Admitting: Family Medicine
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
## 2024-06-19 ENCOUNTER — Encounter: Payer: Self-pay | Admitting: Licensed Clinical Social Worker

## 2024-06-19 ENCOUNTER — Ambulatory Visit: Payer: Self-pay | Admitting: Licensed Clinical Social Worker
